# Patient Record
Sex: Male | Born: 1980
Health system: Southern US, Community
[De-identification: ages and names within clinical notes are randomized; demographics above are authoritative.]

## PROBLEM LIST (undated history)

## (undated) DIAGNOSIS — K219 Gastro-esophageal reflux disease without esophagitis: Secondary | ICD-10-CM

## (undated) DIAGNOSIS — I10 Essential (primary) hypertension: Secondary | ICD-10-CM

## (undated) HISTORY — PX: COLONOSCOPY: SHX174

---

## 2006-03-27 ENCOUNTER — Emergency Department (HOSPITAL_COMMUNITY): Admission: EM | Admit: 2006-03-27 | Discharge: 2006-03-27 | Payer: Self-pay | Admitting: Family Medicine

## 2006-07-04 ENCOUNTER — Emergency Department (HOSPITAL_COMMUNITY): Admission: EM | Admit: 2006-07-04 | Discharge: 2006-07-04 | Payer: Self-pay | Admitting: Family Medicine

## 2006-11-24 ENCOUNTER — Emergency Department (HOSPITAL_COMMUNITY): Admission: EM | Admit: 2006-11-24 | Discharge: 2006-11-24 | Payer: Self-pay | Admitting: Emergency Medicine

## 2007-01-11 ENCOUNTER — Emergency Department (HOSPITAL_COMMUNITY): Admission: EM | Admit: 2007-01-11 | Discharge: 2007-01-11 | Payer: Self-pay | Admitting: Family Medicine

## 2007-01-20 ENCOUNTER — Emergency Department (HOSPITAL_COMMUNITY): Admission: EM | Admit: 2007-01-20 | Discharge: 2007-01-20 | Payer: Self-pay | Admitting: Family Medicine

## 2013-09-22 ENCOUNTER — Ambulatory Visit
Admission: RE | Admit: 2013-09-22 | Discharge: 2013-09-22 | Disposition: A | Discharge status: Home / Self Care | Payer: Worker's Compensation | Source: Ambulatory Visit | Attending: Family | Admitting: Family

## 2013-09-22 ENCOUNTER — Other Ambulatory Visit: Payer: Self-pay | Admitting: Family

## 2013-09-22 DIAGNOSIS — M79672 Pain in left foot: Secondary | ICD-10-CM

## 2013-11-11 ENCOUNTER — Other Ambulatory Visit: Payer: Self-pay | Admitting: Family Medicine

## 2013-11-11 ENCOUNTER — Ambulatory Visit
Admission: RE | Admit: 2013-11-11 | Discharge: 2013-11-11 | Disposition: A | Discharge status: Home / Self Care | Payer: Worker's Compensation | Source: Ambulatory Visit | Attending: Family Medicine | Admitting: Family Medicine

## 2013-11-11 DIAGNOSIS — M25511 Pain in right shoulder: Secondary | ICD-10-CM

## 2013-11-11 DIAGNOSIS — M79621 Pain in right upper arm: Secondary | ICD-10-CM

## 2014-10-08 IMAGING — CR DG OS CALCIS 2+V*L*
2 series · 2 of 2 positions shown · non-contrast
Comparison: None.

CLINICAL DATA: Heel pain since an injury after jumping from a truck
today.

EXAM:
LEFT OS CALCIS - 2+ VIEW

[view not recorded (1 of 2)]
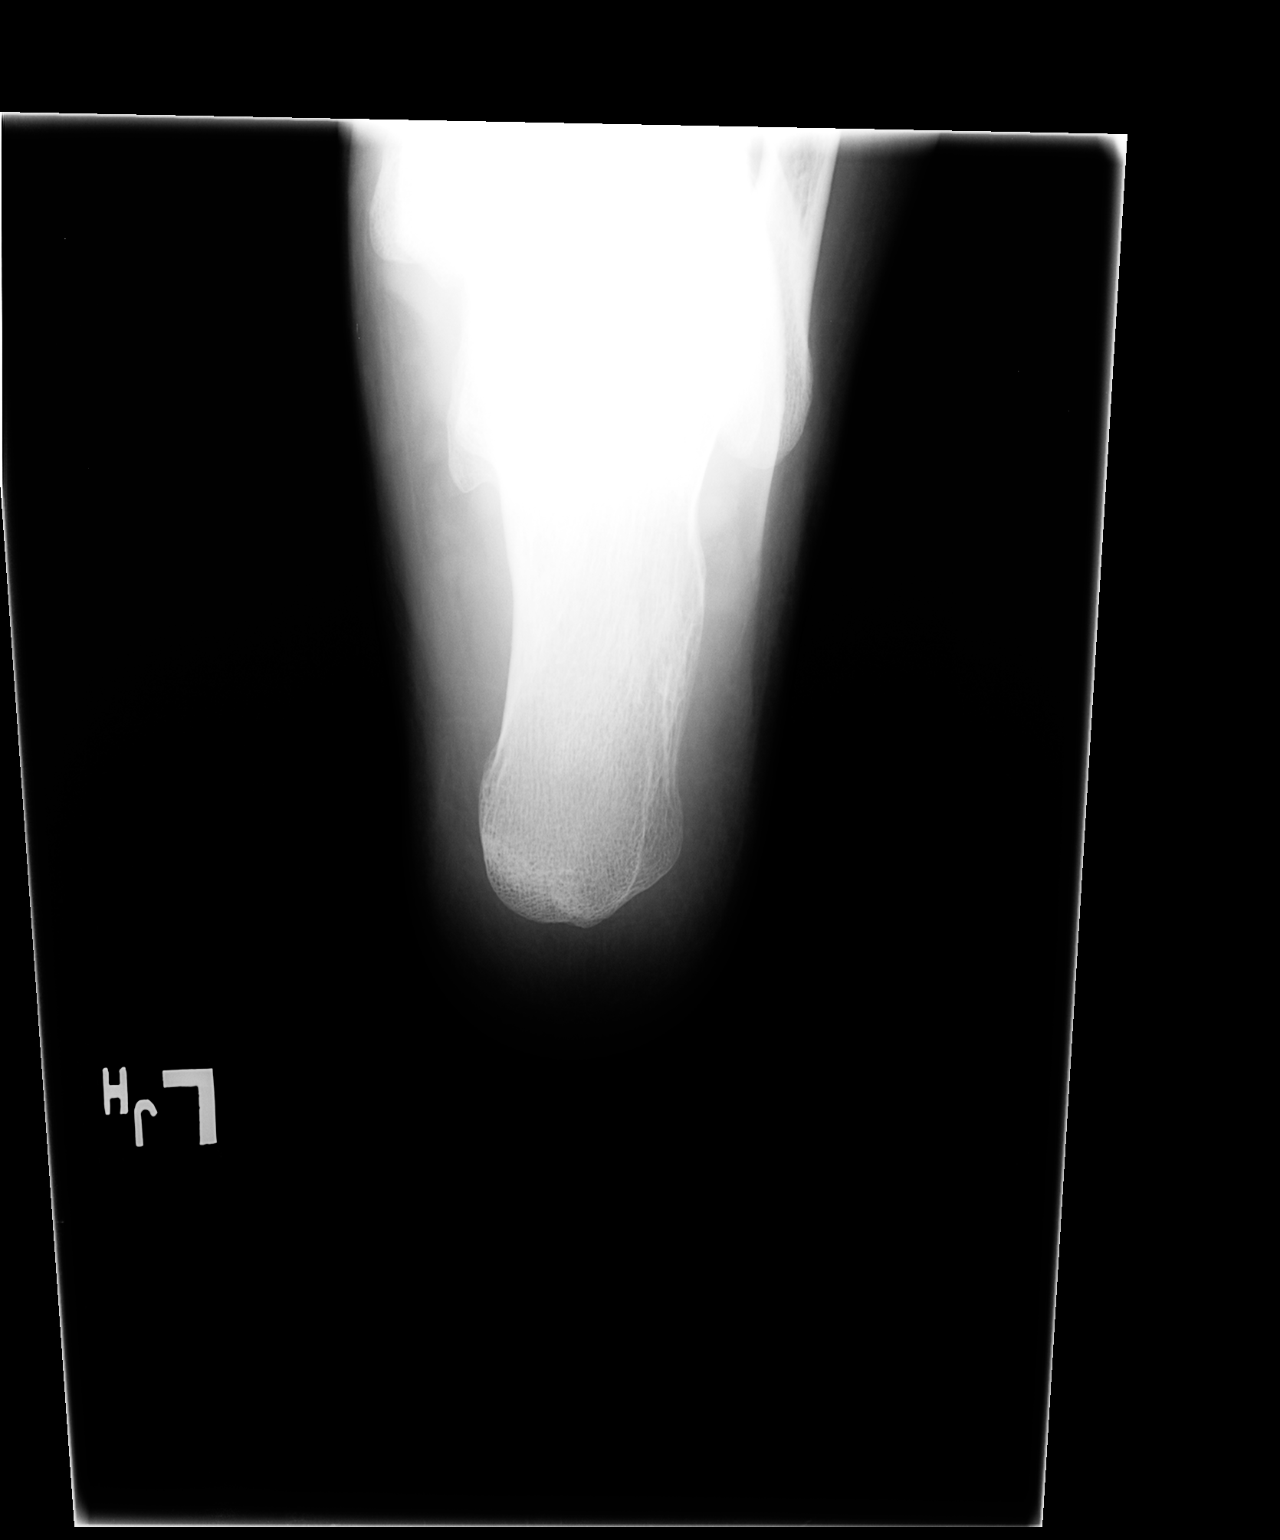

[view not recorded (2 of 2)]
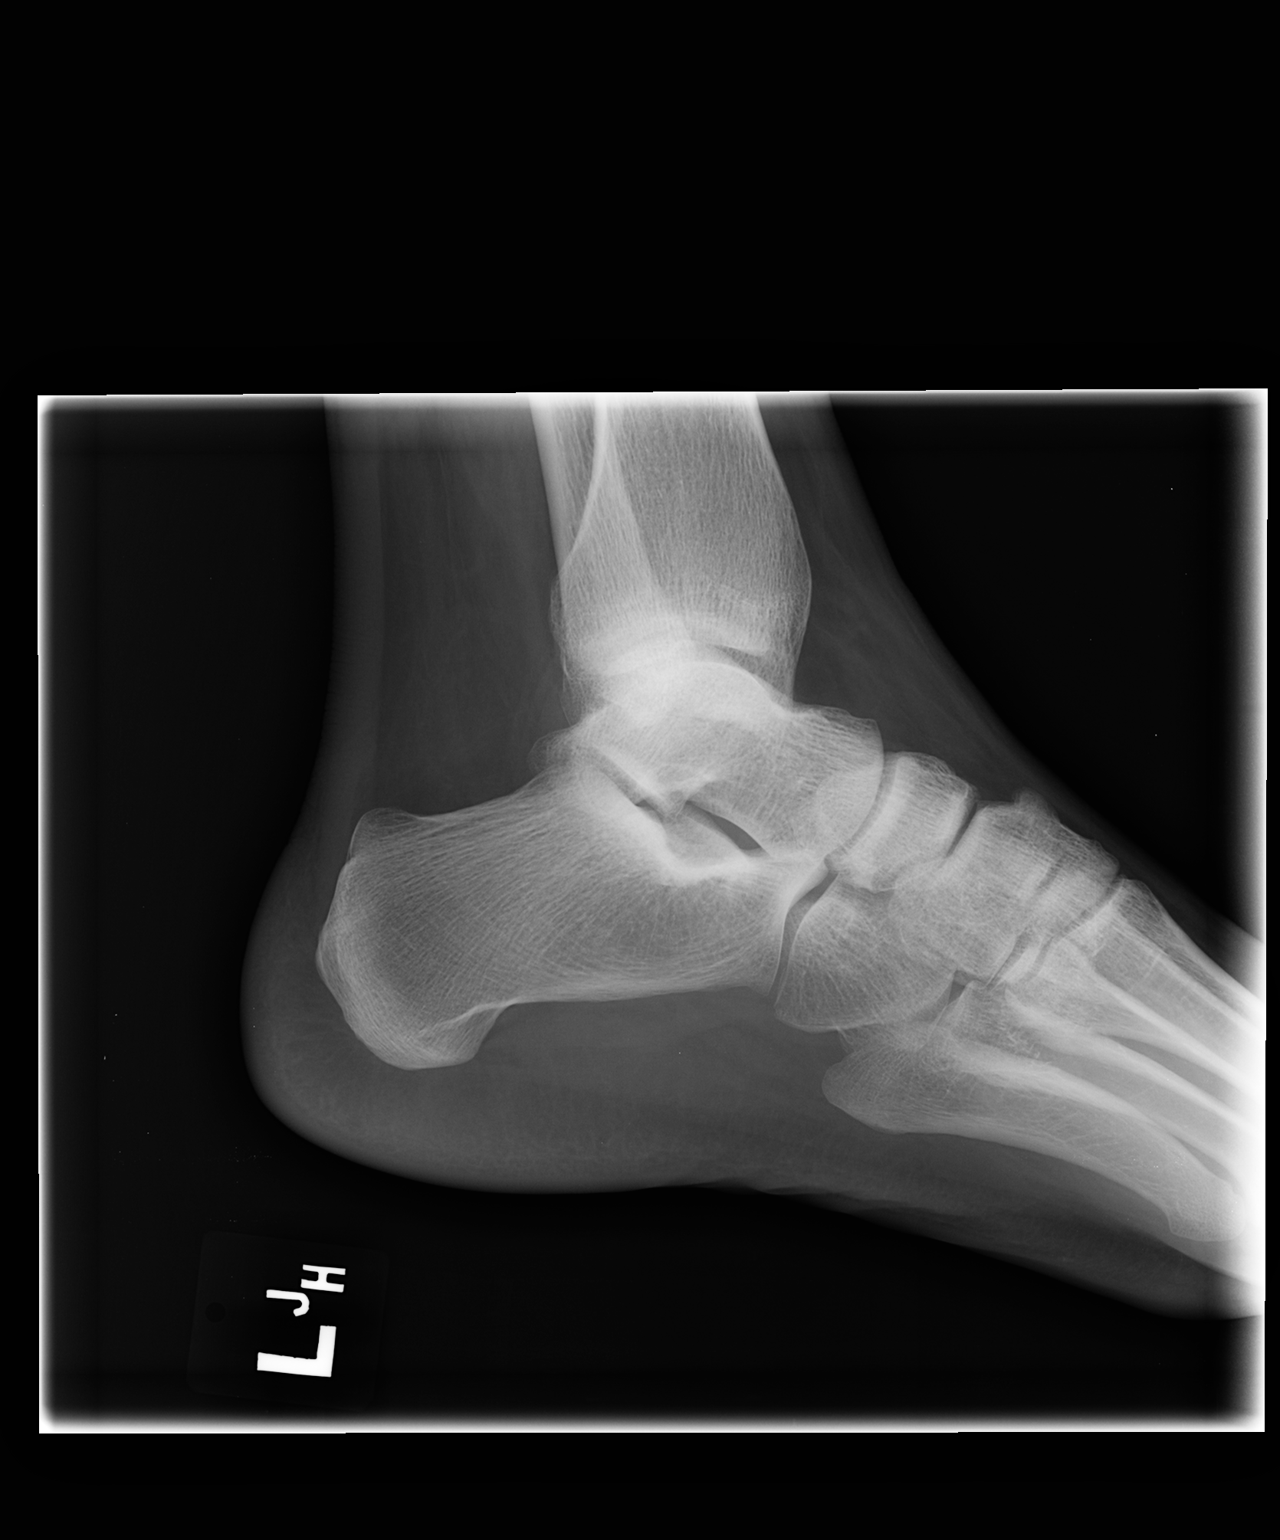

[2 of 2 positions shown; findings below may reference images not displayed]

FINDINGS: There is no evidence of fracture or other focal bone lesions. Soft
tissues are unremarkable.
IMPRESSION: Normal exam.

## 2014-11-27 IMAGING — CR DG SHOULDER 2+V*R*
3 series · 3 of 3 positions shown · non-contrast
Comparison: None.

CLINICAL DATA: Initial visit for injury today while at work, fell
pain and pop in proximal right humerus and right shoulder while
throwing a bag this morning and now hit feels like that shoulder is
tight with decreased range of motion

EXAM:
RIGHT SHOULDER - 2+ VIEW

[w shoulder ap internal righ]
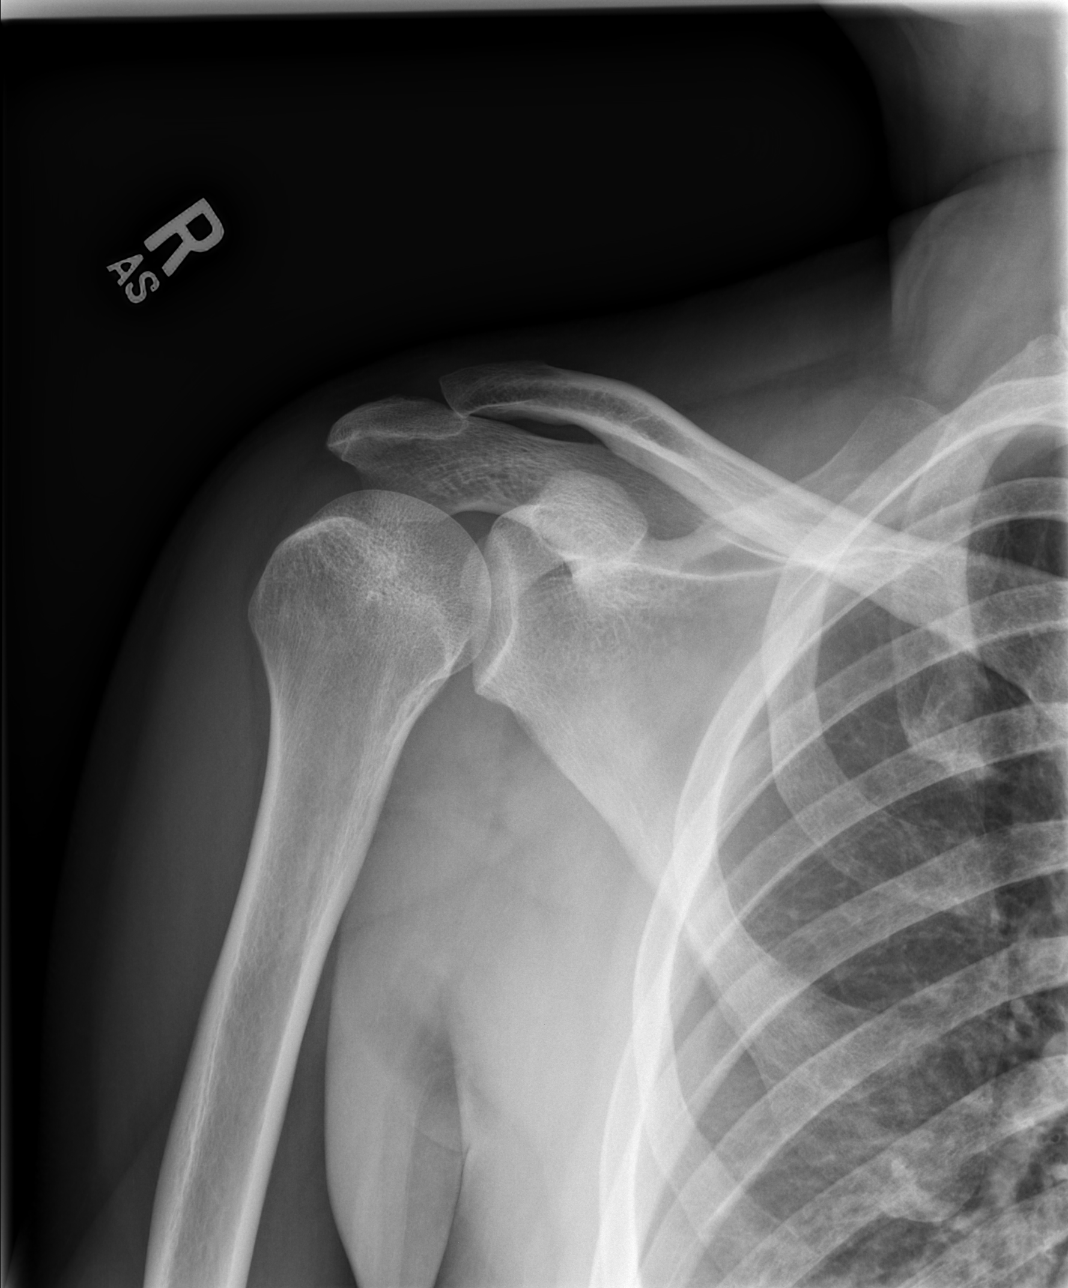

[w shoulder y view right *]
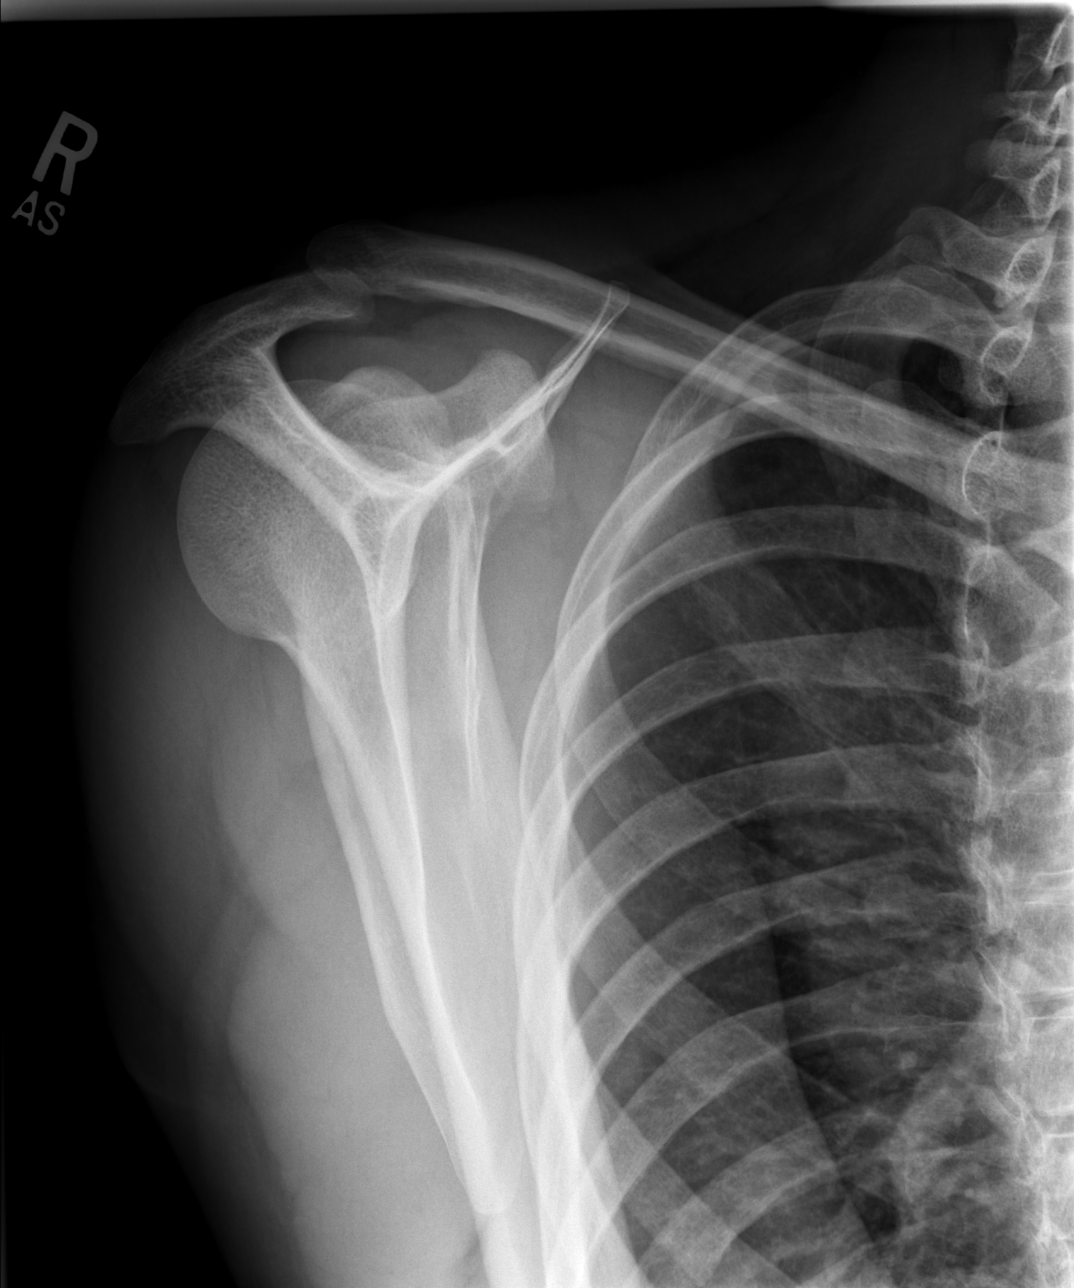

[w shoulder axillary right *]
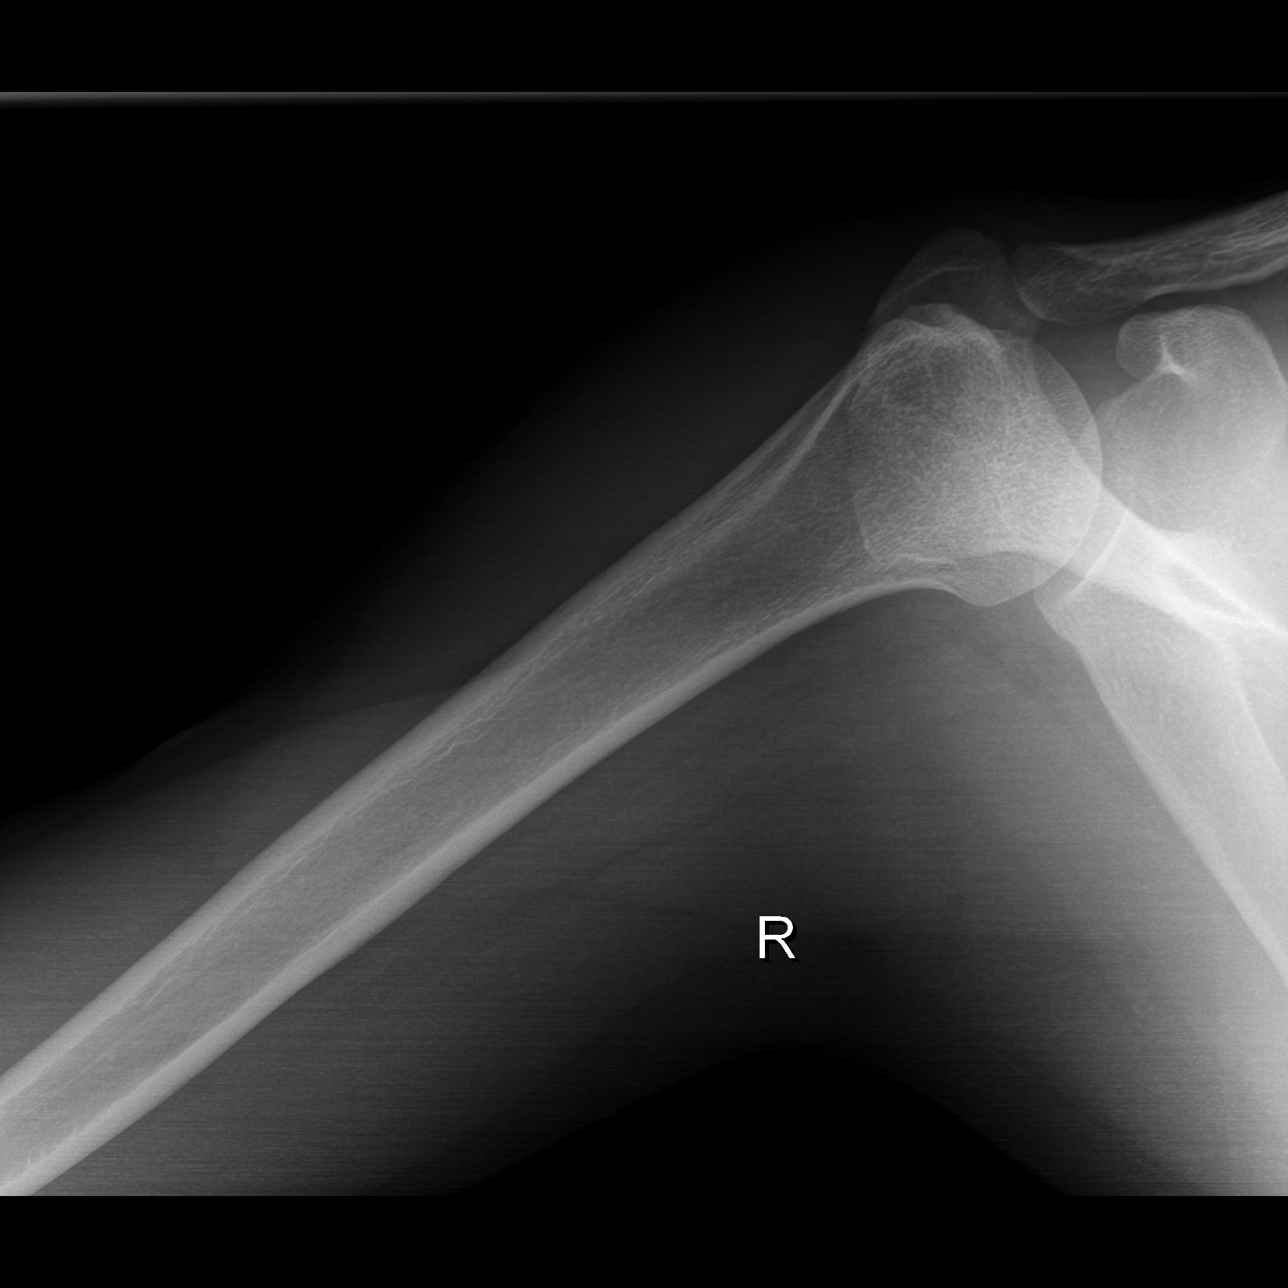

[3 of 3 positions shown; findings below may reference images not displayed]

FINDINGS: There is no evidence of fracture or dislocation. There is no
evidence of arthropathy or other focal bone abnormality. Soft
tissues are unremarkable.
IMPRESSION: Negative. These results will be called to the ordering clinician or
representative by the [HOSPITAL] at the imaging location.

## 2016-07-04 DIAGNOSIS — R030 Elevated blood-pressure reading, without diagnosis of hypertension: Secondary | ICD-10-CM | POA: Diagnosis not present

## 2016-07-04 DIAGNOSIS — Z0000 Encounter for general adult medical examination without abnormal findings: Secondary | ICD-10-CM | POA: Diagnosis not present

## 2016-11-10 DIAGNOSIS — Z23 Encounter for immunization: Secondary | ICD-10-CM | POA: Diagnosis not present

## 2016-11-10 DIAGNOSIS — J45909 Unspecified asthma, uncomplicated: Secondary | ICD-10-CM | POA: Diagnosis not present

## 2016-11-10 DIAGNOSIS — I10 Essential (primary) hypertension: Secondary | ICD-10-CM | POA: Diagnosis not present

## 2017-04-13 DIAGNOSIS — T148XXA Other injury of unspecified body region, initial encounter: Secondary | ICD-10-CM | POA: Diagnosis not present

## 2017-04-13 DIAGNOSIS — J309 Allergic rhinitis, unspecified: Secondary | ICD-10-CM | POA: Diagnosis not present

## 2017-07-05 DIAGNOSIS — I10 Essential (primary) hypertension: Secondary | ICD-10-CM | POA: Diagnosis not present

## 2017-07-05 DIAGNOSIS — Z79899 Other long term (current) drug therapy: Secondary | ICD-10-CM | POA: Diagnosis not present

## 2017-07-05 DIAGNOSIS — Z0000 Encounter for general adult medical examination without abnormal findings: Secondary | ICD-10-CM | POA: Diagnosis not present

## 2019-05-02 ENCOUNTER — Ambulatory Visit: Payer: Self-pay | Attending: Internal Medicine

## 2019-05-02 DIAGNOSIS — Z20822 Contact with and (suspected) exposure to covid-19: Secondary | ICD-10-CM | POA: Insufficient documentation

## 2019-05-03 LAB — SARS-COV-2, NAA 2 DAY TAT

## 2019-05-03 LAB — NOVEL CORONAVIRUS, NAA: SARS-CoV-2, NAA: NOT DETECTED

## 2019-06-09 ENCOUNTER — Ambulatory Visit: Payer: Self-pay | Attending: Internal Medicine

## 2019-06-09 DIAGNOSIS — Z20822 Contact with and (suspected) exposure to covid-19: Secondary | ICD-10-CM | POA: Insufficient documentation

## 2019-06-10 LAB — SARS-COV-2, NAA 2 DAY TAT

## 2019-06-10 LAB — NOVEL CORONAVIRUS, NAA: SARS-CoV-2, NAA: NOT DETECTED

## 2019-11-18 ENCOUNTER — Emergency Department (HOSPITAL_COMMUNITY)
Admission: EM | Admit: 2019-11-18 | Discharge: 2019-11-19 | Disposition: A | Discharge status: Home / Self Care | Payer: 59 | Attending: Emergency Medicine | Admitting: Emergency Medicine

## 2019-11-18 ENCOUNTER — Other Ambulatory Visit: Payer: Self-pay

## 2019-11-18 ENCOUNTER — Encounter (HOSPITAL_COMMUNITY): Payer: Self-pay | Admitting: Pharmacy Technician

## 2019-11-18 DIAGNOSIS — R1013 Epigastric pain: Secondary | ICD-10-CM | POA: Insufficient documentation

## 2019-11-18 DIAGNOSIS — Z5321 Procedure and treatment not carried out due to patient leaving prior to being seen by health care provider: Secondary | ICD-10-CM | POA: Insufficient documentation

## 2019-11-18 LAB — CBC
HCT: 42.9 % (ref 39.0–52.0)
Hemoglobin: 14 g/dL (ref 13.0–17.0)
MCH: 29.1 pg (ref 26.0–34.0)
MCHC: 32.6 g/dL (ref 30.0–36.0)
MCV: 89.2 fL (ref 80.0–100.0)
Platelets: 361 10*3/uL (ref 150–400)
RBC: 4.81 MIL/uL (ref 4.22–5.81)
RDW: 12.9 % (ref 11.5–15.5)
WBC: 8 10*3/uL (ref 4.0–10.5)
nRBC: 0 % (ref 0.0–0.2)

## 2019-11-18 LAB — COMPREHENSIVE METABOLIC PANEL
ALT: 26 U/L (ref 0–44)
AST: 28 U/L (ref 15–41)
Albumin: 4.8 g/dL (ref 3.5–5.0)
Alkaline Phosphatase: 45 U/L (ref 38–126)
Anion gap: 11 (ref 5–15)
BUN: 9 mg/dL (ref 6–20)
CO2: 26 mmol/L (ref 22–32)
Calcium: 10.1 mg/dL (ref 8.9–10.3)
Chloride: 100 mmol/L (ref 98–111)
Creatinine, Ser: 1.2 mg/dL (ref 0.61–1.24)
GFR, Estimated: >60 mL/min (ref >60)
Glucose, Bld: 113 mg/dL — ABNORMAL HIGH (ref 70–99)
Potassium: 3.5 mmol/L (ref 3.5–5.1)
Sodium: 137 mmol/L (ref 135–145)
Total Bilirubin: 1.5 mg/dL — ABNORMAL HIGH (ref 0.3–1.2)
Total Protein: 8 g/dL (ref 6.5–8.1)

## 2019-11-18 LAB — LIPASE, BLOOD: Lipase: 39 U/L (ref 11–51)

## 2019-11-18 NOTE — ED Triage Notes (Signed)
Pt here with epigastric pain described as acid reflux. Pt states took some medicine without relief.

## 2019-11-18 NOTE — ED Notes (Signed)
Pt walked by clinical staff and said "have a good evening" and observed walking out of the waiting room.

## 2021-09-05 ENCOUNTER — Other Ambulatory Visit: Payer: Self-pay | Admitting: Gastroenterology

## 2021-09-05 ENCOUNTER — Encounter
Admission: EM | Disposition: A | Discharge status: Home / Self Care | Payer: Self-pay | Source: Home / Self Care | Attending: Emergency Medicine

## 2021-09-05 ENCOUNTER — Encounter: Payer: Self-pay | Admitting: Emergency Medicine

## 2021-09-05 ENCOUNTER — Other Ambulatory Visit: Payer: Self-pay

## 2021-09-05 ENCOUNTER — Emergency Department: Payer: 59 | Admitting: Anesthesiology

## 2021-09-05 ENCOUNTER — Ambulatory Visit
Admission: EM | Admit: 2021-09-05 | Discharge: 2021-09-05 | Disposition: A | Discharge status: Home / Self Care | Payer: 59 | Attending: Emergency Medicine | Admitting: Emergency Medicine

## 2021-09-05 DIAGNOSIS — K222 Esophageal obstruction: Secondary | ICD-10-CM | POA: Diagnosis not present

## 2021-09-05 DIAGNOSIS — I10 Essential (primary) hypertension: Secondary | ICD-10-CM | POA: Insufficient documentation

## 2021-09-05 DIAGNOSIS — K200 Eosinophilic esophagitis: Secondary | ICD-10-CM | POA: Diagnosis not present

## 2021-09-05 DIAGNOSIS — K219 Gastro-esophageal reflux disease without esophagitis: Secondary | ICD-10-CM | POA: Insufficient documentation

## 2021-09-05 DIAGNOSIS — F17210 Nicotine dependence, cigarettes, uncomplicated: Secondary | ICD-10-CM | POA: Insufficient documentation

## 2021-09-05 DIAGNOSIS — T18108A Unspecified foreign body in esophagus causing other injury, initial encounter: Secondary | ICD-10-CM | POA: Diagnosis present

## 2021-09-05 DIAGNOSIS — K209 Esophagitis, unspecified without bleeding: Secondary | ICD-10-CM

## 2021-09-05 HISTORY — DX: Essential (primary) hypertension: I10

## 2021-09-05 HISTORY — DX: Gastro-esophageal reflux disease without esophagitis: K21.9

## 2021-09-05 HISTORY — PX: ESOPHAGOGASTRODUODENOSCOPY (EGD) WITH PROPOFOL: SHX5813

## 2021-09-05 LAB — COMPREHENSIVE METABOLIC PANEL
ALT: 38 U/L (ref 0–44)
AST: 36 U/L (ref 15–41)
Albumin: 4.9 g/dL (ref 3.5–5.0)
Alkaline Phosphatase: 57 U/L (ref 38–126)
Anion gap: 11 (ref 5–15)
BUN: 14 mg/dL (ref 6–20)
CO2: 26 mmol/L (ref 22–32)
Calcium: 9.6 mg/dL (ref 8.9–10.3)
Chloride: 105 mmol/L (ref 98–111)
Creatinine, Ser: 0.92 mg/dL (ref 0.61–1.24)
GFR, Estimated: >60 mL/min (ref >60)
Glucose, Bld: 114 mg/dL — ABNORMAL HIGH (ref 70–99)
Potassium: 3.4 mmol/L — ABNORMAL LOW (ref 3.5–5.1)
Sodium: 142 mmol/L (ref 135–145)
Total Bilirubin: 1.8 mg/dL — ABNORMAL HIGH (ref 0.3–1.2)
Total Protein: 8.6 g/dL — ABNORMAL HIGH (ref 6.5–8.1)

## 2021-09-05 LAB — CBC
HCT: 47.4 % (ref 39.0–52.0)
Hemoglobin: 16 g/dL (ref 13.0–17.0)
MCH: 29.5 pg (ref 26.0–34.0)
MCHC: 33.8 g/dL (ref 30.0–36.0)
MCV: 87.5 fL (ref 80.0–100.0)
Platelets: 400 10*3/uL (ref 150–400)
RBC: 5.42 MIL/uL (ref 4.22–5.81)
RDW: 12.8 % (ref 11.5–15.5)
WBC: 5.6 10*3/uL (ref 4.0–10.5)
nRBC: 0 % (ref 0.0–0.2)

## 2021-09-05 SURGERY — ESOPHAGOGASTRODUODENOSCOPY (EGD) WITH PROPOFOL
Anesthesia: General

## 2021-09-05 MED ORDER — PROPOFOL 10 MG/ML IV BOLUS
INTRAVENOUS | Status: AC
  Filled 2021-09-05: qty 20

## 2021-09-05 MED ORDER — PROPOFOL 500 MG/50ML IV EMUL
INTRAVENOUS | Status: DC | PRN
  Administered 2021-09-05: 180 ug/kg/min via INTRAVENOUS

## 2021-09-05 MED ORDER — PANTOPRAZOLE SODIUM 40 MG PO TBEC: 40.0000 mg | DELAYED_RELEASE_TABLET | Freq: Every day | ORAL | 3 refills | Status: DC

## 2021-09-05 MED ORDER — SODIUM CHLORIDE 0.9 % IV SOLN: INTRAVENOUS | Status: DC

## 2021-09-05 MED ORDER — SODIUM CHLORIDE 0.9 % IV BOLUS
1000.0000 mL | Freq: Once | INTRAVENOUS | Status: AC
  Administered 2021-09-05: 1000 mL via INTRAVENOUS

## 2021-09-05 MED ORDER — LIDOCAINE HCL (PF) 2 % IJ SOLN
INTRAMUSCULAR | Status: AC
  Filled 2021-09-05: qty 5

## 2021-09-05 MED ORDER — LIDOCAINE HCL (CARDIAC) PF 100 MG/5ML IV SOSY
PREFILLED_SYRINGE | INTRAVENOUS | Status: DC | PRN
  Administered 2021-09-05: 80 mg via INTRAVENOUS

## 2021-09-05 MED ORDER — GLUCAGON HCL RDNA (DIAGNOSTIC) 1 MG IJ SOLR
1.0000 mg | Freq: Once | INTRAMUSCULAR | Status: AC
  Administered 2021-09-05: 1 mg via INTRAVENOUS
  Filled 2021-09-05: qty 1

## 2021-09-05 NOTE — Consult Note (Signed)
Consultation  Referring Provider:     Dr Lenard Lance Admit date 09/05/21 Consult date     09/05/21    Reason for Consultation:     food impaction         HPI:   Dakota Payne is a 41 y.o. male with history of gerd who presented today with acute dysphagia- felt like the seafood he ate night before last was stuck in esophagus and he was unable to get it out. He was given some glucagon and is now feeling better, able to drink liquid. States he has had some ongoing solid food dysphagia for some time that has been increasing in frequency but Saturday night's episode was the worst he has had. States he has some acid reflux occasionally. Endorses rare use of nsaids. Denies all other GI concerns. No history of egd.  Denies any problems with sedated procedures.  Has been npo with the exception of the glucagon and sips for several hours. States he is otherwise healthy.  PREVIOUS ENDOSCOPIES:            Colonoscopy- 2023 by patient report done in [place] with negative findings (father had colon cancer)   Past Medical History:  Diagnosis Date   GERD (gastroesophageal reflux disease)    HTN (hypertension)   HTN  Past Surgical History:  Procedure Laterality Date   COLONOSCOPY      Family History  Problem Relation Age of Onset   Colon cancer Father      Social History   Tobacco Use   Smoking status: Every Day    Types: Cigarettes   Smokeless tobacco: Never  Substance Use Topics   Alcohol use: Yes    Prior to Admission medications   Medication Sig Start Date End Date Taking? Authorizing Provider  amLODipine (NORVASC) 10 MG tablet Take 10 mg by mouth daily.   Yes [provider]    No current facility-administered medications for this encounter.   Current Outpatient Medications  Medication Sig Dispense Refill   amLODipine (NORVASC) 10 MG tablet Take 10 mg by mouth daily.      Allergies as of 09/05/2021   (No Known Allergies)     Review of Systems:    All systems  reviewed and negative except where noted in HPI.    Physical Exam:  Vital signs in last 24 hours: Temp:  [98.4 F (36.9 C)-98.8 F (37.1 C)] 98.8 F (37.1 C) (07/31 1331) Pulse Rate:  [99-115] 99 (07/31 1331) Resp:  [16-18] 18 (07/31 1331) BP: (148-167)/(101-132) 148/101 (07/31 1331) SpO2:  [97 %-100 %] 97 % (07/31 1331) Weight:  [94.3 kg] 94.3 kg (07/31 0902)   General:   Pleasant man in NAD Head:  Normocephalic and atraumatic. Eyes:   No icterus.   Conjunctiva pink. Ears:  Normal auditory acuity. Mouth: Mucosa pink moist, no lesions. Neck:  Supple; no masses felt Lungs:  Respirations even and unlabored. Lungs clear to auscultation bilaterally.   No wheezes, crackles, or rhonchi.  Heart:  S1S2, RRR, no MRG. No edema. Abdomen:   Flat, soft, nondistended, nontender. Normal bowel sounds. No appreciable masses or hepatomegaly. No rebound signs or other peritoneal signs. Rectal:  Not performed.  Msk:  MAEW x4, No clubbing or cyanosis. Strength 5/5. Symmetrical without gross deformities. Neurologic:  Alert and  oriented x4;  Cranial nerves II-XII intact.  Skin:  Warm, dry, pink without significant lesions or rashes. Psych:  Alert and cooperative. Normal affect.  LAB RESULTS: Recent Labs  09/05/21 0935  WBC 5.6  HGB 16.0  HCT 47.4  PLT 400   BMET Recent Labs    09/05/21 0935  NA 142  K 3.4*  CL 105  CO2 26  GLUCOSE 114*  BUN 14  CREATININE 0.92  CALCIUM 9.6   LFT Recent Labs    09/05/21 0935  PROT 8.6*  ALBUMIN 4.9  AST 36  ALT 38  ALKPHOS 57  BILITOT 1.8*   PT/INR No results for input(s): "LABPROT", "INR" in the last 72 hours.  STUDIES: No results found.     Impression / Plan:   Acute on chronic dysphagia- feeling better now. EGD today for luminal assessment. Indication/benefit/risks discussed with him and he is agreeable. Further recommendations to follow.  Thank you very much for this consult. These services were provided by Vevelyn Pat,  NP-C, in collaboration with Regis Bill MD, with whom I have discussed this patient in full.   Vevelyn Pat, NP-C

## 2021-09-05 NOTE — Op Note (Signed)
[facility] Patient Name: Dakota Payne Procedure Date: 09/05/2021 2:41 PM MRN: [id number] Account #: [id number] Date of Birth: 1980/10/17 Admit Type: Outpatient Age: 41 Room: [facility] ENDO ROOM 3 Gender: Male Note Status: Finalized Instrument Name: Upper Endoscope [id number] Procedure:             Upper GI endoscopy Indications:           Foreign body in the esophagus Providers:             Andrey Farmer MD, MD Referring MD:          No Local Md, MD (Referring MD) Medicines:             Monitored Anesthesia Care Complications:         No immediate complications. Estimated blood loss:                         Minimal. Procedure:             Pre-Anesthesia Assessment:                        - Prior to the procedure, a History and Physical was                         performed, and patient medications and allergies were                         reviewed. The patient is competent. The risks and                         benefits of the procedure and the sedation options and                         risks were discussed with the patient. All questions                         were answered and informed consent was obtained.                         Patient identification and proposed procedure were                         verified by the physician, the nurse, the                         anesthesiologist, the anesthetist and the technician                         in the endoscopy suite. Mental Status Examination:                         alert and oriented. Airway Examination: normal                         oropharyngeal airway and neck mobility. Respiratory                         Examination: clear to auscultation. CV Examination:  normal. Prophylactic Antibiotics: The patient does not                         require prophylactic antibiotics. Prior                         Anticoagulants: The patient has taken no previous                          anticoagulant or antiplatelet agents. ASA Grade                         Assessment: II - A patient with mild systemic disease.                         After reviewing the risks and benefits, the patient                         was deemed in satisfactory condition to undergo the                         procedure. The anesthesia plan was to use monitored                         anesthesia care (MAC). Immediately prior to                         administration of medications, the patient was                         re-assessed for adequacy to receive sedatives. The                         heart rate, respiratory rate, oxygen saturations,                         blood pressure, adequacy of pulmonary ventilation, and                         response to care were monitored throughout the                         procedure. The physical status of the patient was                         re-assessed after the procedure.                        After obtaining informed consent, the endoscope was                         passed under direct vision. Throughout the procedure,                         the patient's blood pressure, pulse, and oxygen                         saturations were monitored continuously. The Endoscope  was introduced through the mouth, and advanced to the                         second part of duodenum. The upper GI endoscopy was                         accomplished without difficulty. The patient tolerated                         the procedure well. Findings:      One benign-appearing, intrinsic severe (stenosis; an endoscope cannot       pass) stenosis was found. The stenosis was traversed with gentle       pressure from the scope. Estimated blood loss was minimal. Biopsies were       obtained from the proximal and distal esophagus with cold forceps for       histology of suspected eosinophilic esophagitis. Estimated blood loss       was minimal.      LA  Grade C (one or more mucosal breaks continuous between tops of 2 or       more mucosal folds, less than 75% circumference) esophagitis with no       bleeding was found.      The entire examined stomach was normal.      The examined duodenum was normal. Impression:            - Benign-appearing esophageal stenosis. Biopsied.                        - LA Grade C esophagitis with no bleeding.                        - Normal stomach.                        - Normal examined duodenum. Recommendation:        - Discharge patient to home.                        - Resume previous diet.                        - Continue present medications.                        - Await pathology results.                        - Return to GI clinic at appointment to be scheduled.                        - Repeat upper endoscopy in 8 weeks for retreatment.                        - Return to referring physician as previously                         scheduled. Procedure Code(s):     --- Professional ---                        [phone], Esophagogastroduodenoscopy,  flexible,                         transoral; with biopsy, single or multiple Diagnosis Code(s):     --- Professional ---                        K22.2, Esophageal obstruction                        K20.90, Esophagitis, unspecified without bleeding                        T18.108A, Unspecified foreign body in esophagus                         causing other injury, initial encounter CPT copyright 2019 American Medical Association. All rights reserved. The codes documented in this report are preliminary and upon coder review may  be revised to meet current compliance requirements. Andrey Farmer MD, MD 09/05/2021 3:14:02 PM Number of Addenda: 0 Note Initiated On: 09/05/2021 2:41 PM Estimated Blood Loss:  Estimated blood loss was minimal.      [facility]

## 2021-09-05 NOTE — ED Notes (Signed)
Pt states he feels like the food bolus has passed   is able to drink water  Provider aware

## 2021-09-05 NOTE — Progress Notes (Signed)
-   PPI ordered 

## 2021-09-05 NOTE — Anesthesia Procedure Notes (Signed)
Date/Time: 09/05/2021 2:48 PM  Performed by: Wayna Chalet, CindyPre-anesthesia Checklist: Patient identified, Emergency Drugs available, Suction available, Patient being monitored and Timeout performed Patient Re-evaluated:Patient Re-evaluated prior to induction Oxygen Delivery Method: Nasal cannula Preoxygenation: Pre-oxygenation with 100% oxygen Induction Type: IV induction Airway Equipment and Method: Bite block Placement Confirmation: positive ETCO2 and CO2 detector

## 2021-09-05 NOTE — Transfer of Care (Signed)
Immediate Anesthesia Transfer of Care Note  Patient: Dakota Payne  Procedure(s) Performed: ESOPHAGOGASTRODUODENOSCOPY (EGD) WITH PROPOFOL  Patient Location: PACU  Anesthesia Type:General  Level of Consciousness: awake and sedated  Airway & Oxygen Therapy: Patient Spontanous Breathing and Patient connected to nasal cannula oxygen  Post-op Assessment: Report given to RN and Post -op Vital signs reviewed and stable  Post vital signs: Reviewed and stable  Last Vitals:  Vitals Value Taken Time  BP    Temp    Pulse    Resp    SpO2      Last Pain:  Vitals:   09/05/21 1408  TempSrc: Tympanic  PainSc: 0-No pain         Complications: No notable events documented.

## 2021-09-05 NOTE — Anesthesia Preprocedure Evaluation (Signed)
Anesthesia Evaluation  Patient identified by MRN, date of birth, ID band Patient awake    Reviewed: Allergy & Precautions, H&P , NPO status , Patient's Chart, lab work & pertinent test results, reviewed documented beta blocker date and time   History of Anesthesia Complications Negative for: history of anesthetic complications  Airway Mallampati: I  TM Distance: >3 FB Neck ROM: full    Dental  (+) Dental Advidsory Given, Teeth Intact Permanent bridge on top left:   Pulmonary neg shortness of breath, neg COPD, neg recent URI, Current Smoker,    Pulmonary exam normal breath sounds clear to auscultation       Cardiovascular Exercise Tolerance: Good hypertension, (-) angina(-) Past MI and (-) Cardiac Stents Normal cardiovascular exam(-) dysrhythmias (-) Valvular Problems/Murmurs Rhythm:regular Rate:Normal     Neuro/Psych negative neurological ROS  negative psych ROS   GI/Hepatic Neg liver ROS, GERD  ,  Endo/Other  negative endocrine ROS  Renal/GU negative Renal ROS  negative genitourinary   Musculoskeletal   Abdominal   Peds  Hematology negative hematology ROS (+)   Anesthesia Other Findings Past Medical History: No date: GERD (gastroesophageal reflux disease) No date: HTN (hypertension)   Reproductive/Obstetrics negative OB ROS                             Anesthesia Physical Anesthesia Plan  ASA: 2  Anesthesia Plan: General   Post-op Pain Management:    Induction: Intravenous  PONV Risk Score and Plan: 1 and Propofol infusion and TIVA  Airway Management Planned: Natural Airway and Nasal Cannula  Additional Equipment:   Intra-op Plan:   Post-operative Plan:   Informed Consent: I have reviewed the patients History and Physical, chart, labs and discussed the procedure including the risks, benefits and alternatives for the proposed anesthesia with the patient or authorized  representative who has indicated his/her understanding and acceptance.     Dental Advisory Given  Plan Discussed with: Anesthesiologist, CRNA and Surgeon  Anesthesia Plan Comments:         Anesthesia Quick Evaluation

## 2021-09-05 NOTE — ED Notes (Signed)
See triage note  Presents with some diff swallowing  States he feels like something is stuck since Friday   Hx of same in past .Having a hard time swallowing water

## 2021-09-05 NOTE — ED Triage Notes (Signed)
Says thinks something is stuck in throat.  Says he cannot get anything down--not even water.  All weekend has been this way.

## 2021-09-05 NOTE — ED Provider Notes (Signed)
[facility] Provider Note    Event Date/Time   First MD Initiated Contact with Patient 09/05/21 [phone]     (approximate)  History   Chief Complaint: Gastroesophageal Reflux  HPI  Dakota Payne is a 41 y.o. male with a past medical history of gastric reflux who presents to the emergency department unable to swallow food/water.  According to the patient and wife over the past few years the patient has intermittently been having food get stuck when he swallows.  He states usually after some time it will pass on its own however since eating seafood on Saturday he feels like the food has not been able to pass.  Patient states even when he tries to drink water it comes back up.  No history of endoscopy previously.  Denies any pain.  Physical Exam   Triage Vital Signs: ED Triage Vitals  Enc Vitals Group     BP 09/05/21 0850 (!) 167/132     Pulse Rate 09/05/21 0850 (!) 115     Resp 09/05/21 0850 16     Temp 09/05/21 0850 98.4 F (36.9 C)     Temp Source 09/05/21 0850 Oral     SpO2 09/05/21 0850 100 %     Weight 09/05/21 0902 207 lb 14.3 oz (94.3 kg)     Height 09/05/21 0902 6\' 4"  (1.93 m)     Head Circumference --      Peak Flow --      Pain Score 09/05/21 0845 6     Pain Loc --      Pain Edu? --      Excl. in GC? --     Most recent vital signs: Vitals:   09/05/21 0850  BP: (!) 167/132  Pulse: (!) 115  Resp: 16  Temp: 98.4 F (36.9 C)  SpO2: 100%    General: Awake, no distress.  CV:  Good peripheral perfusion.  Regular rate and rhythm  Resp:  Normal effort.  Equal breath sounds bilaterally.  Abd:  No distention.  Soft, nontender.  No rebound or guarding.   ED Results / Procedures / Treatments   MEDICATIONS ORDERED IN ED: Medications  glucagon (GLUCAGEN) injection 1 mg (has no administration in time range)  sodium chloride 0.9 % bolus 1,000 mL (has no administration in time range)     IMPRESSION / MDM / ASSESSMENT AND PLAN / ED COURSE  I  reviewed the triage vital signs and the nursing notes.  Patient's presentation is most consistent with acute presentation with potential threat to life or bodily function.  Patient presents to the emergency department with possible food bolus obstruction.  Patient states he has been unable to swallow even liquids over the past 48 hours.  Patient states this is happened multiple times in the past but every time it happens it will clear on its own eventually this time it has not been clearing on its own.  No history of endoscopy previously.  Patient does state a fairly significant history of gastric reflux.  Differential would include food bolus obstruction, esophagitis, scar tissue ring.  We will check basic labs dose IV glucagon normal saline and closely monitor.  If patient is unable to swallow after IV glucagon we will likely proceed with GI consultation for consideration of endoscopy.  After glucagon patient continues to be unable to swallow water.  I spoke to Dr. 09/07/21 of GI medicine who will be coming in to see the patient and likely proceed  with endoscopy.  Patient's chemistry and CBC are reassuring.  Patient feels like most of the obstruction has passed although he is not sure.  States he is able to drink liquids at least now.  I spoke to Dr. Mia Creek who recommends the patient still follow through with the upper endoscopy to rule out any residual blockage as the patient may require dilation as well.  Patient agreeable.  FINAL CLINICAL IMPRESSION(S) / ED DIAGNOSES   Esophageal obstruction  Note:  This document was prepared using Dragon voice recognition software and may include unintentional dictation errors.   Minna Antis, MD 09/05/21 1044

## 2021-09-06 ENCOUNTER — Encounter: Payer: Self-pay | Admitting: Gastroenterology

## 2021-09-06 NOTE — Anesthesia Postprocedure Evaluation (Signed)
Anesthesia Post Note  Patient: Dakota Payne  Procedure(s) Performed: ESOPHAGOGASTRODUODENOSCOPY (EGD) WITH PROPOFOL  Patient location during evaluation: Endoscopy Anesthesia Type: General Level of consciousness: awake and alert Pain management: pain level controlled Vital Signs Assessment: post-procedure vital signs reviewed and stable Respiratory status: spontaneous breathing, nonlabored ventilation, respiratory function stable and patient connected to nasal cannula oxygen Cardiovascular status: blood pressure returned to baseline and stable Postop Assessment: no apparent nausea or vomiting Anesthetic complications: no   No notable events documented.   Last Vitals:  Vitals:   09/05/21 1459 09/05/21 1519  BP:  (!) 146/98  Pulse:    Resp:    Temp: 37 C   SpO2:      Last Pain:  Vitals:   09/05/21 1519  TempSrc:   PainSc: 0-No pain                 Lenard Simmer

## 2021-09-07 LAB — SURGICAL PATHOLOGY

## 2021-11-01 ENCOUNTER — Encounter: Payer: Self-pay | Admitting: Anesthesiology

## 2021-11-01 ENCOUNTER — Encounter
Admission: RE | Disposition: A | Discharge status: Home / Self Care | Payer: Self-pay | Source: Home / Self Care | Attending: Gastroenterology

## 2021-11-01 ENCOUNTER — Ambulatory Visit
Admission: RE | Admit: 2021-11-01 | Discharge: 2021-11-01 | Disposition: A | Discharge status: Home / Self Care | Payer: 59 | Attending: Gastroenterology | Admitting: Gastroenterology

## 2021-11-01 ENCOUNTER — Ambulatory Visit: Payer: 59 | Admitting: Anesthesiology

## 2021-11-01 DIAGNOSIS — I10 Essential (primary) hypertension: Secondary | ICD-10-CM | POA: Insufficient documentation

## 2021-11-01 DIAGNOSIS — K222 Esophageal obstruction: Secondary | ICD-10-CM | POA: Diagnosis not present

## 2021-11-01 DIAGNOSIS — K200 Eosinophilic esophagitis: Secondary | ICD-10-CM | POA: Insufficient documentation

## 2021-11-01 DIAGNOSIS — Z87891 Personal history of nicotine dependence: Secondary | ICD-10-CM | POA: Insufficient documentation

## 2021-11-01 DIAGNOSIS — Z79899 Other long term (current) drug therapy: Secondary | ICD-10-CM | POA: Diagnosis not present

## 2021-11-01 HISTORY — PX: ESOPHAGOGASTRODUODENOSCOPY (EGD) WITH PROPOFOL: SHX5813

## 2021-11-01 SURGERY — ESOPHAGOGASTRODUODENOSCOPY (EGD) WITH PROPOFOL
Anesthesia: General

## 2021-11-01 MED ORDER — LIDOCAINE HCL (CARDIAC) PF 100 MG/5ML IV SOSY
PREFILLED_SYRINGE | INTRAVENOUS | Status: DC | PRN
  Administered 2021-11-01: 100 mg via INTRAVENOUS

## 2021-11-01 MED ORDER — PROPOFOL 500 MG/50ML IV EMUL
INTRAVENOUS | Status: DC | PRN
  Administered 2021-11-01: 200 ug/kg/min via INTRAVENOUS

## 2021-11-01 MED ORDER — LIDOCAINE HCL (PF) 2 % IJ SOLN
INTRAMUSCULAR | Status: AC
  Filled 2021-11-01: qty 5

## 2021-11-01 MED ORDER — PROPOFOL 10 MG/ML IV BOLUS
INTRAVENOUS | Status: AC
  Filled 2021-11-01: qty 20

## 2021-11-01 MED ORDER — SODIUM CHLORIDE 0.9 % IV SOLN: INTRAVENOUS | Status: DC

## 2021-11-01 NOTE — Anesthesia Procedure Notes (Signed)
Date/Time: 11/01/2021 9:49 AM  Performed by: Donalda Ewings, CindyPre-anesthesia Checklist: Patient identified, Emergency Drugs available, Suction available, Patient being monitored and Timeout performed Patient Re-evaluated:Patient Re-evaluated prior to induction Oxygen Delivery Method: Nasal cannula Preoxygenation: Pre-oxygenation with 100% oxygen Induction Type: IV induction Airway Equipment and Method: Bite block Placement Confirmation: positive ETCO2 and CO2 detector

## 2021-11-01 NOTE — Op Note (Signed)
[facility] Gastroenterology Patient Name: Dakota Payne Procedure Date: 11/01/2021 9:38 AM MRN: [id number] Account #: [id number] Date of Birth: Sep 04, 1980 Admit Type: Outpatient Age: 41 Room: [facility] ENDO ROOM 1 Gender: Male Note Status: Finalized Instrument Name: Upper Endoscope [id number] Procedure:             Upper GI endoscopy Indications:           Eosinophilic esophagitis Providers:             Andrey Farmer MD, MD Referring MD:          No Local Md, MD (Referring MD) Medicines:             Monitored Anesthesia Care Complications:         No immediate complications. Estimated blood loss:                         Minimal. Procedure:             Pre-Anesthesia Assessment:                        - Prior to the procedure, a History and Physical was                         performed, and patient medications and allergies were                         reviewed. The patient is competent. The risks and                         benefits of the procedure and the sedation options and                         risks were discussed with the patient. All questions                         were answered and informed consent was obtained.                         Patient identification and proposed procedure were                         verified by the physician, the nurse, the                         anesthesiologist, the anesthetist and the technician                         in the endoscopy suite. Mental Status Examination:                         alert and oriented. Airway Examination: normal                         oropharyngeal airway and neck mobility. Respiratory                         Examination: clear to auscultation. CV Examination:  normal. Prophylactic Antibiotics: The patient does not                         require prophylactic antibiotics. Prior                         Anticoagulants: The patient has taken no previous                          anticoagulant or antiplatelet agents. ASA Grade                         Assessment: II - A patient with mild systemic disease.                         After reviewing the risks and benefits, the patient                         was deemed in satisfactory condition to undergo the                         procedure. The anesthesia plan was to use monitored                         anesthesia care (MAC). Immediately prior to                         administration of medications, the patient was                         re-assessed for adequacy to receive sedatives. The                         heart rate, respiratory rate, oxygen saturations,                         blood pressure, adequacy of pulmonary ventilation, and                         response to care were monitored throughout the                         procedure. The physical status of the patient was                         re-assessed after the procedure.                        After obtaining informed consent, the endoscope was                         passed under direct vision. Throughout the procedure,                         the patient's blood pressure, pulse, and oxygen                         saturations were monitored continuously. The Endoscope  was introduced through the mouth, and advanced to the                         second part of duodenum. The upper GI endoscopy was                         accomplished without difficulty. The patient tolerated                         the procedure well. Findings:      A widely patent Schatzki ring was found in the lower third of the       esophagus. Dilation not attempted as patient denies any dysphagia.      Normal mucosa was found in the entire esophagus. Biopsies were obtained       from the proximal and distal esophagus with cold forceps for histology       of suspected eosinophilic esophagitis. Estimated blood loss was minimal.      The entire examined  stomach was normal.      The examined duodenum was normal. Impression:            - Widely patent Schatzki ring.                        - Normal mucosa was found in the entire esophagus.                         Biopsied.                        - Normal stomach.                        - Normal examined duodenum. Recommendation:        - Discharge patient to home.                        - Resume previous diet.                        - Continue present medications.                        - Await pathology results.                        - Return to referring physician as previously                         scheduled. Procedure Code(s):     --- Professional ---                        [phone], Esophagogastroduodenoscopy, flexible,                         transoral; with biopsy, single or multiple Diagnosis Code(s):     --- Professional ---                        K22.2, Esophageal obstruction  Y65.9, Eosinophilic esophagitis CPT copyright 2019 American Medical Association. All rights reserved. The codes documented in this report are preliminary and upon coder review may  be revised to meet current compliance requirements. Andrey Farmer MD, MD 11/01/2021 10:09:54 AM Number of Addenda: 0 Note Initiated On: 11/01/2021 9:38 AM Estimated Blood Loss:  Estimated blood loss was minimal.      [facility]

## 2021-11-01 NOTE — Interval H&P Note (Signed)
History and Physical Interval Note:  11/01/2021 9:44 AM  Dakota Payne  has presented today for surgery, with the diagnosis of Eosinophilic Esophagitis.  The various methods of treatment have been discussed with the patient and family. After consideration of risks, benefits and other options for treatment, the patient has consented to  Procedure(s): ESOPHAGOGASTRODUODENOSCOPY (EGD) WITH PROPOFOL (N/A) as a surgical intervention.  The patient's history has been reviewed, patient examined, no change in status, stable for surgery.  I have reviewed the patient's chart and labs.  Questions were answered to the patient's satisfaction.     Lesly Rubenstein  Ok to proceed with EGD

## 2021-11-01 NOTE — H&P (Signed)
Outpatient short stay form Pre-procedure 11/01/2021  Dakota Rubenstein, MD  Primary Physician: Jeannie Done, PA-C  Reason for visit:  History of esophagitis  History of present illness:    41 y/o gentleman with history of hypertension and recent history of food bolus with findings of severe esophagitis and EoE on endoscopy here for EGD to assess healing and for potential dilation. Patient without dysphagia currently. No blood thinners. No family history of GI malignancies.    Current Facility-Administered Medications:    0.9 %  sodium chloride infusion, , Intravenous, Continuous, Niall Illes, Hilton Cork, MD, Last Rate: 20 mL/hr at 11/01/21 0917, Continued from Pre-op at 11/01/21 0917  Medications Prior to Admission  Medication Sig Dispense Refill Last Dose   amLODipine (NORVASC) 10 MG tablet Take 10 mg by mouth daily.   10/31/2021   pantoprazole (PROTONIX) 40 MG tablet Take 1 tablet (40 mg total) by mouth daily. 30 tablet 3 10/31/2021     No Known Allergies   Past Medical History:  Diagnosis Date   GERD (gastroesophageal reflux disease)    HTN (hypertension)     Review of systems:  Otherwise negative.    Physical Exam  Gen: Alert, oriented. Appears stated age.  HEENT: PERRLA. Lungs: No respiratory distress CV: RRR Abd: soft, benign, no masses Ext: No edema    Planned procedures: Proceed with EGD. The patient understands the nature of the planned procedure, indications, risks, alternatives and potential complications including but not limited to bleeding, infection, perforation, damage to internal organs and possible oversedation/side effects from anesthesia. The patient agrees and gives consent to proceed.  Please refer to procedure notes for findings, recommendations and patient disposition/instructions.     Dakota Rubenstein, MD [facility] Gastroenterology

## 2021-11-01 NOTE — Anesthesia Preprocedure Evaluation (Addendum)
Anesthesia Evaluation  Patient identified by MRN, date of birth, ID band Patient awake    Reviewed: Allergy & Precautions, NPO status , Patient's Chart, lab work & pertinent test results  History of Anesthesia Complications Negative for: history of anesthetic complications  Airway Mallampati: III   Neck ROM: Full    Dental   Bridge :   Pulmonary former smoker (quit greater than 1 year ago),    Pulmonary exam normal breath sounds clear to auscultation       Cardiovascular hypertension, Normal cardiovascular exam Rhythm:Regular Rate:Normal     Neuro/Psych negative neurological ROS     GI/Hepatic GERD  ,  Endo/Other  negative endocrine ROS  Renal/GU negative Renal ROS     Musculoskeletal   Abdominal   Peds  Hematology negative hematology ROS (+)   Anesthesia Other Findings   Reproductive/Obstetrics                            Anesthesia Physical Anesthesia Plan  ASA: 2  Anesthesia Plan: General   Post-op Pain Management:    Induction: Intravenous  PONV Risk Score and Plan: 1 and Propofol infusion, TIVA and Treatment may vary due to age or medical condition  Airway Management Planned: Natural Airway  Additional Equipment:   Intra-op Plan:   Post-operative Plan:   Informed Consent: I have reviewed the patients History and Physical, chart, labs and discussed the procedure including the risks, benefits and alternatives for the proposed anesthesia with the patient or authorized representative who has indicated his/her understanding and acceptance.       Plan Discussed with: CRNA  Anesthesia Plan Comments: (LMA/GETA backup discussed.  Patient consented for risks of anesthesia including but not limited to:  - adverse reactions to medications - damage to eyes, teeth, lips or other oral mucosa - nerve damage due to positioning  - sore throat or hoarseness - damage to heart,  brain, nerves, lungs, other parts of body or loss of life  Informed patient about role of CRNA in peri- and intra-operative care.  Patient voiced understanding.)        Anesthesia Quick Evaluation

## 2021-11-01 NOTE — Transfer of Care (Signed)
Immediate Anesthesia Transfer of Care Note  Patient: Dakota Payne  Procedure(s) Performed: ESOPHAGOGASTRODUODENOSCOPY (EGD) WITH PROPOFOL  Patient Location: PACU  Anesthesia Type:General  Level of Consciousness: awake and sedated  Airway & Oxygen Therapy: Patient Spontanous Breathing and Patient connected to nasal cannula oxygen  Post-op Assessment: Report given to RN and Post -op Vital signs reviewed and stable  Post vital signs: Reviewed and stable  Last Vitals:  Vitals Value Taken Time  BP    Temp    Pulse    Resp    SpO2      Last Pain:  Vitals:   11/01/21 0852  TempSrc: Temporal  PainSc: 0-No pain         Complications: No notable events documented.

## 2021-11-01 NOTE — Anesthesia Postprocedure Evaluation (Signed)
Anesthesia Post Note  Patient: Milan Perkins Coffin  Procedure(s) Performed: ESOPHAGOGASTRODUODENOSCOPY (EGD) WITH PROPOFOL  Patient location during evaluation: PACU Anesthesia Type: General Level of consciousness: awake and alert, oriented and patient cooperative Pain management: pain level controlled Vital Signs Assessment: post-procedure vital signs reviewed and stable Respiratory status: spontaneous breathing, nonlabored ventilation and respiratory function stable Cardiovascular status: blood pressure returned to baseline and stable Postop Assessment: adequate PO intake Anesthetic complications: no   No notable events documented.   Last Vitals:  Vitals:   11/01/21 1002 11/01/21 1022  BP: (!) 114/90 (!) 146/101  Pulse:  82  Resp:  (!) 21  Temp: (!) 35.6 C   SpO2:  100%    Last Pain:  Vitals:   11/01/21 1022  TempSrc:   PainSc: 0-No pain                 Darrin Nipper

## 2021-11-02 ENCOUNTER — Encounter: Payer: Self-pay | Admitting: Gastroenterology

## 2021-11-02 LAB — SURGICAL PATHOLOGY

## 2022-01-25 ENCOUNTER — Other Ambulatory Visit: Payer: Self-pay | Admitting: Family Medicine

## 2022-01-25 ENCOUNTER — Ambulatory Visit
Admission: RE | Admit: 2022-01-25 | Discharge: 2022-01-25 | Disposition: A | Discharge status: Home / Self Care | Payer: 59 | Source: Ambulatory Visit | Attending: Family Medicine | Admitting: Family Medicine

## 2022-01-25 DIAGNOSIS — M79644 Pain in right finger(s): Secondary | ICD-10-CM

## 2023-03-08 DIAGNOSIS — F4323 Adjustment disorder with mixed anxiety and depressed mood: Secondary | ICD-10-CM | POA: Diagnosis not present

## 2023-04-05 DIAGNOSIS — F4323 Adjustment disorder with mixed anxiety and depressed mood: Secondary | ICD-10-CM | POA: Diagnosis not present

## 2023-05-10 DIAGNOSIS — F4323 Adjustment disorder with mixed anxiety and depressed mood: Secondary | ICD-10-CM | POA: Diagnosis not present

## 2023-08-09 DIAGNOSIS — F4323 Adjustment disorder with mixed anxiety and depressed mood: Secondary | ICD-10-CM | POA: Diagnosis not present

## 2023-09-20 DIAGNOSIS — F4323 Adjustment disorder with mixed anxiety and depressed mood: Secondary | ICD-10-CM | POA: Diagnosis not present

## 2023-10-18 DIAGNOSIS — F4323 Adjustment disorder with mixed anxiety and depressed mood: Secondary | ICD-10-CM | POA: Diagnosis not present

## 2023-11-08 DIAGNOSIS — F4323 Adjustment disorder with mixed anxiety and depressed mood: Secondary | ICD-10-CM | POA: Diagnosis not present

## 2023-11-22 DIAGNOSIS — J309 Allergic rhinitis, unspecified: Secondary | ICD-10-CM | POA: Diagnosis not present

## 2023-11-22 DIAGNOSIS — K219 Gastro-esophageal reflux disease without esophagitis: Secondary | ICD-10-CM | POA: Diagnosis not present

## 2023-11-22 DIAGNOSIS — Z79899 Other long term (current) drug therapy: Secondary | ICD-10-CM | POA: Diagnosis not present

## 2023-11-22 DIAGNOSIS — I10 Essential (primary) hypertension: Secondary | ICD-10-CM | POA: Diagnosis not present

## 2023-11-22 DIAGNOSIS — Z125 Encounter for screening for malignant neoplasm of prostate: Secondary | ICD-10-CM | POA: Diagnosis not present

## 2023-11-22 DIAGNOSIS — Z0000 Encounter for general adult medical examination without abnormal findings: Secondary | ICD-10-CM | POA: Diagnosis not present

## 2023-11-22 DIAGNOSIS — E785 Hyperlipidemia, unspecified: Secondary | ICD-10-CM | POA: Diagnosis not present

## 2023-11-22 DIAGNOSIS — R7301 Impaired fasting glucose: Secondary | ICD-10-CM | POA: Diagnosis not present

## 2023-12-13 DIAGNOSIS — F4323 Adjustment disorder with mixed anxiety and depressed mood: Secondary | ICD-10-CM | POA: Diagnosis not present

## 2024-01-23 NOTE — Progress Notes (Unsigned)
 New Patient Note  RE: Dakota Payne MRN: [id number] DOB: December 10, 1980 Date of Office Visit: 01/24/2024  Consult requested by: Waters, Clotilda Murray, PA-C Primary care provider: Darral Clotilda Murray, PA-C  Chief Complaint: No chief complaint on file.  History of Present Illness: I had the pleasure of seeing Dakota Payne for initial evaluation at the [facility] on 01/24/2024. He is a 43 y.o. male, who is referred here by Darral Clotilda Murray, PA-C for the evaluation of ***.  Discussed the use of AI scribe software for clinical note transcription with the patient, who gave verbal consent to proceed.  History of Present Illness             ***  Assessment and Plan: Dakota Payne is a 43 y.o. male with: ***  Assessment and Plan               No follow-ups on file.  No orders of the defined types were placed in this encounter.  Lab Orders  No laboratory test(s) ordered today    Other allergy screening: Asthma: {Blank single:19197::yes,no} Rhino conjunctivitis: {Blank single:19197::yes,no} Food allergy: {Blank single:19197::yes,no} Medication allergy: {Blank single:19197::yes,no} Hymenoptera allergy: {Blank single:19197::yes,no} Urticaria: {Blank single:19197::yes,no} Eczema:{Blank single:19197::yes,no} History of recurrent infections suggestive of immunodeficency: {Blank single:19197::yes,no}  Diagnostics: Spirometry:  Tracings reviewed. His effort: {Blank single:19197::Good reproducible efforts.,It was hard to get consistent efforts and there is a question as to whether this reflects a maximal maneuver.,Poor effort, data can not be interpreted.} FVC: ***L FEV1: ***L, ***% predicted FEV1/FVC ratio: ***% Interpretation: {Blank single:19197::Spirometry consistent with mild obstructive disease,Spirometry consistent with moderate obstructive disease,Spirometry consistent with severe obstructive disease,Spirometry  consistent with possible restrictive disease,Spirometry consistent with mixed obstructive and restrictive disease,Spirometry uninterpretable due to technique,Spirometry consistent with normal pattern,No overt abnormalities noted given today's efforts}.  Please see scanned spirometry results for details.  Skin Testing: {Blank single:19197::Select foods,Environmental allergy panel,Environmental allergy panel and select foods,Food allergy panel,None,Deferred due to recent antihistamines use}. *** Results discussed with patient/family.   Past Medical History: There are no active problems to display for this patient.  Past Medical History:  Diagnosis Date   GERD (gastroesophageal reflux disease)    HTN (hypertension)    Past Surgical History: Past Surgical History:  Procedure Laterality Date   COLONOSCOPY     ESOPHAGOGASTRODUODENOSCOPY (EGD) WITH PROPOFOL  N/A 09/05/2021   Procedure: ESOPHAGOGASTRODUODENOSCOPY (EGD) WITH PROPOFOL ;  Surgeon: Maryruth Ole DASEN, MD;  Location: [facility] ENDOSCOPY;  Service: Endoscopy;  Laterality: N/A;   ESOPHAGOGASTRODUODENOSCOPY (EGD) WITH PROPOFOL  N/A 11/01/2021   Procedure: ESOPHAGOGASTRODUODENOSCOPY (EGD) WITH PROPOFOL ;  Surgeon: Maryruth Ole DASEN, MD;  Location: [facility] ENDOSCOPY;  Service: Endoscopy;  Laterality: N/A;   Medication List:  Current Outpatient Medications  Medication Sig Dispense Refill   amLODipine (NORVASC) 10 MG tablet Take 10 mg by mouth daily.     pantoprazole  (PROTONIX ) 40 MG tablet Take 1 tablet (40 mg total) by mouth daily. 30 tablet 3   No current facility-administered medications for this visit.   Allergies: Allergies[1] Social History: Social History   Socioeconomic History   Marital status: Married    Spouse name: Not on file   Number of children: Not on file   Years of education: Not on file   Highest education level: Not on file  Occupational History   Not on file  Tobacco Use    Smoking status: Former    Types: Cigarettes   Smokeless tobacco: Never  Vaping Use   Vaping status: Never Used  Substance  and Sexual Activity   Alcohol use: Yes    Comment: occasional   Drug use: Not on file   Sexual activity: Not on file  Other Topics Concern   Not on file  Social History Narrative   Not on file   Social Drivers of Health   Tobacco Use: Medium Risk (11/01/2021)   Patient History    Smoking Tobacco Use: Former    Smokeless Tobacco Use: Never    Passive Exposure: Not on Actuary Strain: Not on file  Food Insecurity: Not on file  Transportation Needs: Not on file  Physical Activity: Not on file  Stress: Not on file  Social Connections: Not on file  Depression (EYV7-0): Not on file  Alcohol Screen: Not on file  Housing: Not on file  Utilities: Not on file  Health Literacy: Not on file   Lives in a ***. Smoking: *** Occupation: ***  Environmental HistorySurveyor, Minerals in the house: Network Engineer in the family room: {Blank single:19197::yes,no} Carpet in the bedroom: {Blank single:19197::yes,no} Heating: {Blank single:19197::electric,gas,heat pump} Cooling: {Blank single:19197::central,window,heat pump} Pet: {Blank single:19197::yes ***,no}  Family History: Family History  Problem Relation Age of Onset   Colon cancer Father    Problem                               Relation Asthma                                   *** Eczema                                *** Food allergy                          *** Allergic rhino conjunctivitis     ***  Review of Systems  Constitutional:  Negative for appetite change, chills, fever and unexpected weight change.  HENT:  Negative for congestion and rhinorrhea.   Eyes:  Negative for itching.  Respiratory:  Negative for cough, chest tightness, shortness of breath and wheezing.   Cardiovascular:  Negative for chest pain.   Gastrointestinal:  Negative for abdominal pain.  Genitourinary:  Negative for difficulty urinating.  Skin:  Negative for rash.  Neurological:  Negative for headaches.    Objective: There were no vitals taken for this visit. There is no height or weight on file to calculate BMI. Physical Exam Vitals and nursing note reviewed.  Constitutional:      Appearance: Normal appearance. He is well-developed.  HENT:     Head: Normocephalic and atraumatic.     Right Ear: Tympanic membrane and external ear normal.     Left Ear: Tympanic membrane and external ear normal.     Nose: Nose normal.     Mouth/Throat:     Mouth: Mucous membranes are moist.     Pharynx: Oropharynx is clear.  Eyes:     Conjunctiva/sclera: Conjunctivae normal.  Cardiovascular:     Rate and Rhythm: Normal rate and regular rhythm.     Heart sounds: Normal heart sounds. No murmur heard.    No friction rub. No gallop.  Pulmonary:     Effort: Pulmonary effort is normal.     Breath sounds: Normal breath sounds. No wheezing,  rhonchi or rales.  Musculoskeletal:     Cervical back: Neck supple.  Skin:    General: Skin is warm.     Findings: No rash.  Neurological:     Mental Status: He is alert and oriented to person, place, and time.  Psychiatric:        Behavior: Behavior normal.   The plan was reviewed with the patient/family, and all questions/concerned were addressed.  It was my pleasure to see Dakota Payne today and participate in his care. Please feel free to contact me with any questions or concerns.  Sincerely,  Orlan Cramp, DO Allergy & Immunology  [facility]  [facility] office: [phone] [facility] office: [phone]     [1] No Known Allergies

## 2024-01-24 ENCOUNTER — Ambulatory Visit: Payer: Self-pay | Admitting: Allergy

## 2024-01-24 ENCOUNTER — Encounter: Payer: Self-pay | Admitting: Allergy

## 2024-01-24 ENCOUNTER — Other Ambulatory Visit: Payer: Self-pay

## 2024-01-24 VITALS — BP 124/76 | HR 64 | Temp 98.4°F | Resp 18 | Ht 76.0 in | Wt 213.8 lb

## 2024-01-24 DIAGNOSIS — Z8709 Personal history of other diseases of the respiratory system: Secondary | ICD-10-CM

## 2024-01-24 DIAGNOSIS — J3089 Other allergic rhinitis: Secondary | ICD-10-CM

## 2024-01-24 DIAGNOSIS — R12 Heartburn: Secondary | ICD-10-CM | POA: Diagnosis not present

## 2024-01-24 NOTE — Patient Instructions (Addendum)
 Rhinitis  Return for allergy skin testing. Will make additional recommendations based on results. Make sure you don't take any antihistamines for 3 days before the skin testing appointment. Don't put any lotion on the back and arms on the day of testing.  Must be in good health and not ill. No vaccines/injections/antibiotics within the past 7 days.  Plan on being here for 30-60 minutes.  Use Flonase (fluticasone) OR Rhinocort OR Nasonex OR Nasacort nasal spray 1-2 sprays per nostril once a day as needed for nasal congestion.  Nasal saline spray (i.e., Simply Saline) or nasal saline lavage (i.e., NeilMed) is recommended as needed and prior to medicated nasal sprays. Will consider ENT referral as well after skin testing is done.   Follow up for skin testing.

## 2024-02-13 NOTE — Progress Notes (Signed)
 "  Skin testing note  RE: Dakota Payne MRN: [id number] DOB: May 12, 1980 Date of Office Visit: 02/14/2024  Referring provider: Darral Clotilda Murray, PA-C Primary care provider: Kip Righter, MD  Chief Complaint: skin testing  History of Present Illness: I had the pleasure of seeing Dakota Payne for a skin testing visit at the [facility] on 02/14/2024. He is a 44 y.o. male, who is being followed for allergic rhinitis and history of asthma. His previous allergy  office visit was on 01/24/2024 with Dr. Luke. Today is a skin testing visit.   Discussed the use of AI scribe software for clinical note transcription with the patient, who gave verbal consent to proceed.    He reports experiencing itching and congestion, and has allergies to pollen, trees, grass, ragweed, mold, dust mites, and various animals such as cats, dogs, horses, and cockroaches. These allergies are both year-round and seasonal.  His work outdoors exacerbates his symptoms. At home, he has a dog but takes measures to minimize exposure by keeping the dog downstairs and using lint rollers and air purifiers. Despite these efforts, he experiences nasal congestion, particularly at night, which leads to waking up feeling foggy and unrefreshed.  He uses nasal sprays, including Vicks and saline, and has a steroid nasal spray which he uses before leaving for work and upon returning. He also cleans his face and nostrils before using these medications. He has not used Singulair  (montelukast ) before.  He notes that his symptoms have worsened since turning 38.     Assessment and Plan: Dakota Payne is a 44 y.o. male with: Other allergic rhinitis Seasonal allergic rhinitis due to pollen Allergic rhinitis due to animal dander Allergic rhinitis due to dust mite Allergic rhinitis due to mold Allergy  to cockroaches Past history - Congestion for over ten years, non-seasonal, no rhinorrhea, sneezing, or pruritus. Flonase and saline  effective previously. No sinus infections, polyps, or CT scans. 02/14/2024 skin testing positive to grass, ragweed, weed, trees, mold, dust mites, cat, dog, horse and cockroach. Start environmental control measures as below. Use over the counter antihistamines such as Zyrtec (cetirizine), Claritin (loratadine), Allegra (fexofenadine), or Xyzal (levocetirizine) daily as needed. May take twice a day during allergy  flares. May switch antihistamines every few months. Start Singulair  (montelukast ) 10mg  daily at night. Cautioned that in some children/adults can experience behavioral changes including hyperactivity, agitation, depression, sleep disturbances and suicidal ideations. These side effects are rare, but if you notice them you should notify me and discontinue Singulair  (montelukast ). Use Flonase (fluticasone) OR Rhinocort OR Nasonex OR Nasacort nasal spray 1-2 sprays per nostril once a day as needed for nasal congestion.  Nasal saline spray (i.e., Simply Saline) or nasal saline lavage (i.e., NeilMed) is recommended as needed and prior to medicated nasal sprays. Recommend allergy  injections. 2 injections.  Let us  know when ready to start.  Had a detailed discussion with patient/family that clinical history is suggestive of allergic rhinitis, and may benefit from allergy  immunotherapy (AIT). Discussed in detail regarding the dosing, schedule, side effects (mild to moderate local allergic reaction and rarely systemic allergic reactions including anaphylaxis), and benefits (significant improvement in nasal symptoms, seasonal flares of asthma) of immunotherapy with the patient. There is significant time commitment involved with allergy  shots, which includes weekly immunotherapy injections for first 9-12 months and then biweekly to monthly injections for 3-5 years.  Refer to ENT to rule out anatomical issues.  Return in about 4 months (around 06/13/2024).  Meds ordered this encounter  Medications    montelukast  (SINGULAIR ) 10 MG tablet    Sig: Take 1 tablet (10 mg total) by mouth at bedtime.    Dispense:  30 tablet    Refill:  5   Lab Orders  No laboratory test(s) ordered today    Diagnostics: Skin Testing: Environmental allergy  panel. 02/14/2024 skin testing positive to grass, ragweed, weed, trees, mold, dust mites, cat, dog, horse and cockroach. Results discussed with patient/family.  Airborne Adult Perc - 02/14/24 1018     Time Antigen Placed 1018    Allergen Manufacturer Jestine    Location Back    Number of Test 55    1. Control-Buffer 50% Glycerol Negative    2. Control-Histamine 3+    3. Bahia Negative    4. Bermuda Negative    5. Johnson Negative    6. Kentucky  Blue Negative    7. Meadow Fescue Negative    8. Perennial Rye Negative    9. Timothy Negative    10. Ragweed Mix Negative    11. Cocklebur Negative    12. Plantain,  English Negative    13. Baccharis Negative    14. Dog Fennel Negative    15. Russian Thistle Negative    16. Lamb's Quarters Negative    17. Sheep Sorrell Negative    18. Rough Pigweed Negative    19. Marsh Elder, Rough Negative    20. Mugwort, Common 2+    21. Box, Elder --   +/-   22. Cedar, red Negative    23. Sweet Gum Negative    24. Pecan Pollen 2+    25. Pine Mix Negative    26. Walnut, Black Pollen Negative    27. Red Mulberry Negative    28. Ash Mix Negative    29. Birch Mix Negative    30. Beech American Negative    31. Cottonwood, Eastern Negative    32. Hickory, White 2+    33. Maple Mix Negative    34. Oak, Eastern Mix Negative    35. Sycamore Eastern Negative    36. Alternaria Alternata Negative    37. Cladosporium Herbarum Negative    38. Aspergillus Mix Negative    39. Penicillium Mix Negative    40. Bipolaris Sorokiniana (Helminthosporium) Negative    41. Drechslera Spicifera (Curvularia) Negative    42. Mucor Plumbeus Negative    43. Fusarium Moniliforme Negative    44. Aureobasidium Pullulans (pullulara)  Negative    45. Rhizopus Oryzae Negative    46. Botrytis Cinera Negative    47. Epicoccum Nigrum Negative    48. Phoma Betae Negative    49. Dust Mite Mix Negative    50. Cat Hair 10,000 BAU/ml 3+    51.  Dog Epithelia 4+    52. Mixed Feathers Negative    53. Horse Epithelia 2+    54. Cockroach, German 3+    55. Tobacco Leaf Negative          Intradermal - 02/14/24 1111     Time Antigen Placed 1105    Allergen Manufacturer Greer    Location Arm    Number of Test 11    Control Negative    Bahia 3+    Bermuda 2+    Johnson 2+    7 Grass Negative    Ragweed Mix 2+    Mold 1 Negative    Mold 2 2+    Mold 3 Negative    Mold 4 Negative  Mite Mix 3+          Previous notes and tests were reviewed. The plan was reviewed with the patient/family, and all questions/concerned were addressed.  It was my pleasure to see Dakota Payne today and participate in his care. Please feel free to contact me with any questions or concerns.  Sincerely,  Orlan Cramp, DO Allergy  & Immunology  [facility]  [place] office: [phone] [facility] office: [phone] "

## 2024-02-14 ENCOUNTER — Ambulatory Visit: Admitting: Allergy

## 2024-02-14 ENCOUNTER — Encounter: Payer: Self-pay | Admitting: Allergy

## 2024-02-14 ENCOUNTER — Telehealth: Payer: Self-pay | Admitting: Allergy

## 2024-02-14 DIAGNOSIS — J301 Allergic rhinitis due to pollen: Secondary | ICD-10-CM

## 2024-02-14 DIAGNOSIS — Z91038 Other insect allergy status: Secondary | ICD-10-CM

## 2024-02-14 DIAGNOSIS — J3081 Allergic rhinitis due to animal (cat) (dog) hair and dander: Secondary | ICD-10-CM

## 2024-02-14 DIAGNOSIS — J3089 Other allergic rhinitis: Secondary | ICD-10-CM

## 2024-02-14 DIAGNOSIS — Z8709 Personal history of other diseases of the respiratory system: Secondary | ICD-10-CM

## 2024-02-14 MED ORDER — MONTELUKAST SODIUM 10 MG PO TABS: 10.0000 mg | ORAL_TABLET | Freq: Every day | ORAL | 5 refills | Status: AC

## 2024-02-14 NOTE — Telephone Encounter (Signed)
 Refer to ENT for nasal congestion. Patient does have significant allergies but want to rule out any anatomical issues.

## 2024-02-14 NOTE — Patient Instructions (Addendum)
 02/14/2024 skin testing positive to grass, ragweed, weed, trees, mold, dust mites, cat, dog, horse and cockroach.  Results given.  Environmental allergies Start environmental control measures as below. Use over the counter antihistamines such as Zyrtec (cetirizine), Claritin (loratadine), Allegra (fexofenadine), or Xyzal (levocetirizine) daily as needed. May take twice a day during allergy  flares. May switch antihistamines every few months. Start Singulair  (montelukast ) 10mg  daily at night. Cautioned that in some children/adults can experience behavioral changes including hyperactivity, agitation, depression, sleep disturbances and suicidal ideations. These side effects are rare, but if you notice them you should notify me and discontinue Singulair  (montelukast ).  Use Flonase (fluticasone) OR Rhinocort OR Nasonex OR Nasacort nasal spray 1-2 sprays per nostril once a day as needed for nasal congestion.  Nasal saline spray (i.e., Simply Saline) or nasal saline lavage (i.e., NeilMed) is recommended as needed and prior to medicated nasal sprays.  Recommend allergy  injections. 2 injections.  Let us  know when ready to start.  Had a detailed discussion with patient/family that clinical history is suggestive of allergic rhinitis, and may benefit from allergy  immunotherapy (AIT). Discussed in detail regarding the dosing, schedule, side effects (mild to moderate local allergic reaction and rarely systemic allergic reactions including anaphylaxis), and benefits (significant improvement in nasal symptoms, seasonal flares of asthma) of immunotherapy with the patient. There is significant time commitment involved with allergy  shots, which includes weekly immunotherapy injections for first 9-12 months and then biweekly to monthly injections for 3-5 years.   Refer to ENT.   Return in about 4 months (around 06/13/2024). Or sooner if needed.    Reducing Pollen Exposure Pollen seasons: trees (spring), grass  (summer) and ragweed/weeds (fall). Keep windows closed in your home and car to lower pollen exposure.  Install air conditioning in the bedroom and throughout the house if possible.  Avoid going out in dry windy days - especially early morning. Pollen counts are highest between 5 - 10 AM and on dry, hot and windy days.  Save outside activities for late afternoon or after a heavy rain, when pollen levels are lower.  Avoid mowing of grass if you have grass pollen allergy . Be aware that pollen can also be transported indoors on people and pets.  Dry your clothes in an automatic dryer rather than hanging them outside where they might collect pollen.  Rinse hair and eyes before bedtime. Mold Control Mold and fungi can grow on a variety of surfaces provided certain temperature and moisture conditions exist.  Outdoor molds grow on plants, decaying vegetation and soil. The major outdoor mold, Alternaria and Cladosporium, are found in very high numbers during hot and dry conditions. Generally, a late summer - fall peak is seen for common outdoor fungal spores. Rain will temporarily lower outdoor mold spore count, but counts rise rapidly when the rainy period ends. The most important indoor molds are Aspergillus and Penicillium. Dark, humid and poorly ventilated basements are ideal sites for mold growth. The next most common sites of mold growth are the bathroom and the kitchen. Outdoor (Seasonal) Mold Control Use air conditioning and keep windows closed. Avoid exposure to decaying vegetation. Avoid leaf raking. Avoid grain handling. Consider wearing a face mask if working in moldy areas.  Indoor (Perennial) Mold Control  Maintain humidity below 50%. Get rid of mold growth on hard surfaces with water, detergent and, if necessary, 5% bleach (do not mix with other cleaners). Then dry the area completely. If mold covers an area more than 10 square feet, consider hiring  an surveyor, quantity. For clothing, washing with soap and water is best. If moldy items cannot be cleaned and dried, throw them away. Remove sources e.g. contaminated carpets. Repair and seal leaking roofs or pipes. Using dehumidifiers in damp basements may be helpful, but empty the water and clean units regularly to prevent mildew from forming. All rooms, especially basements, bathrooms and kitchens, require ventilation and cleaning to deter mold and mildew growth. Avoid carpeting on concrete or damp floors, and storing items in damp areas. Control of House Dust Mite Allergen Dust mite allergens are a common trigger of allergy  and asthma symptoms. While they can be found throughout the house, these microscopic creatures thrive in warm, humid environments such as bedding, upholstered furniture and carpeting. Because so much time is spent in the bedroom, it is essential to reduce mite levels there.  Encase pillows, mattresses, and box springs in special allergen-proof fabric covers or airtight, zippered plastic covers.  Bedding should be washed weekly in hot water (130 F) and dried in a hot dryer. Allergen-proof covers are available for comforters and pillows that cant be regularly washed.  Wash the allergy -proof covers every few months. Minimize clutter in the bedroom. Keep pets out of the bedroom.  Keep humidity less than 50% by using a dehumidifier or air conditioning. You can buy a humidity measuring device called a hygrometer to monitor this.  If possible, replace carpets with hardwood, linoleum, or washable area rugs. If that's not possible, vacuum frequently with a vacuum that has a HEPA filter. Remove all upholstered furniture and non-washable window drapes from the bedroom. Remove all non-washable stuffed toys from the bedroom.  Wash stuffed toys weekly. Pet Allergen Avoidance: Contrary to popular opinion, there are no hypoallergenic breeds of dogs or cats. That is because people are not allergic  to an animals hair, but to an allergen found in the animal's saliva, dander (dead skin flakes) or urine. Pet allergy  symptoms typically occur within minutes. For some people, symptoms can build up and become most severe 8 to 12 hours after contact with the animal. People with severe allergies can experience reactions in public places if dander has been transported on the pet owners clothing. Keeping an animal outdoors is only a partial solution, since homes with pets in the yard still have higher concentrations of animal allergens. Before getting a pet, ask your allergist to determine if you are allergic to animals. If your pet is already considered part of your family, try to minimize contact and keep the pet out of the bedroom and other rooms where you spend a great deal of time. As with dust mites, vacuum carpets often or replace carpet with a hardwood floor, tile or linoleum. High-efficiency particulate air (HEPA) cleaners can reduce allergen levels over time. While dander and saliva are the source of cat and dog allergens, urine is the source of allergens from rabbits, hamsters, mice and guinea pigs; so ask a non-allergic family member to clean the animals cage. If you have a pet allergy , talk to your allergist about the potential for allergy  immunotherapy (allergy  shots). This strategy can often provide long-term relief. Cockroach Allergen Avoidance Cockroaches are often found in the homes of densely populated urban areas, schools or commercial buildings, but these creatures can lurk almost anywhere. This does not mean that you have a dirty house or living area. Block all areas where roaches can enter the home. This includes crevices, wall cracks and windows.  Cockroaches need water to survive, so  fix and seal all leaky faucets and pipes. Have an exterminator go through the house when your family and pets are gone to eliminate any remaining roaches. Keep food in lidded containers and put pet food  dishes away after your pets are done eating. Vacuum and sweep the floor after meals, and take out garbage and recyclables. Use lidded garbage containers in the kitchen. Wash dishes immediately after use and clean under stoves, refrigerators or toasters where crumbs can accumulate. Wipe off the stove and other kitchen surfaces and cupboards regularly.

## 2024-04-03 ENCOUNTER — Institutional Professional Consult (permissible substitution) (INDEPENDENT_AMBULATORY_CARE_PROVIDER_SITE_OTHER): Admitting: Otolaryngology

## 2024-06-12 ENCOUNTER — Ambulatory Visit: Admitting: Allergy
